# Patient Record
Sex: Female | Born: 1975 | Race: Black or African American | Hispanic: No | Marital: Married | Smoking: Never smoker
Health system: Southern US, Community
[De-identification: ages and names within clinical notes are randomized; demographics above are authoritative.]

## PROBLEM LIST (undated history)

## (undated) DIAGNOSIS — I10 Essential (primary) hypertension: Secondary | ICD-10-CM

## (undated) DIAGNOSIS — E119 Type 2 diabetes mellitus without complications: Secondary | ICD-10-CM

## (undated) HISTORY — PX: REDUCTION MAMMAPLASTY: SUR839

## (undated) HISTORY — PX: BREAST REDUCTION SURGERY: SHX8

---

## 2020-11-22 ENCOUNTER — Ambulatory Visit: Payer: Self-pay

## 2020-11-22 NOTE — Telephone Encounter (Signed)
Pt. Has not yet established care. Complains of right breast pain x 1 week. Pain is below the nipple. No other symptoms. Pt. Will go to UC.  Answer Assessment - Initial Assessment Questions 1. SYMPTOM: "What's the main symptom you're concerned about?"  (e.g., lump, pain, rash, nipple discharge)     Right breast pain 2. LOCATION: "Where is the pain located?"     Right 3. ONSET: "When did pain start?"     1 week ago 4. PRIOR HISTORY: "Do you have any history of prior problems with your breasts?" (e.g., lumps, cancer, fibrocystic breast disease)     No 5. CAUSE: "What do you think is causing this symptom?"     Unsure 6. OTHER SYMPTOMS: "Do you have any other symptoms?" (e.g., fever, breast pain, redness or rash, nipple discharge)     No 7. PREGNANCY-BREASTFEEDING: "Is there any chance you are pregnant?" "When was your last menstrual period?" "Are you breastfeeding?"     No  Protocols used: BREAST [facility]

## 2020-11-23 ENCOUNTER — Other Ambulatory Visit: Payer: Self-pay

## 2020-11-23 ENCOUNTER — Encounter (HOSPITAL_COMMUNITY): Payer: Self-pay | Admitting: *Deleted

## 2020-11-23 ENCOUNTER — Ambulatory Visit (HOSPITAL_COMMUNITY)
Admission: EM | Admit: 2020-11-23 | Discharge: 2020-11-23 | Disposition: A | Discharge status: Home / Self Care | Payer: BC Managed Care – PPO | Attending: Student | Admitting: Student

## 2020-11-23 DIAGNOSIS — R0789 Other chest pain: Secondary | ICD-10-CM | POA: Diagnosis not present

## 2020-11-23 DIAGNOSIS — R079 Chest pain, unspecified: Secondary | ICD-10-CM

## 2020-11-23 HISTORY — DX: Essential (primary) hypertension: I10

## 2020-11-23 HISTORY — DX: Type 2 diabetes mellitus without complications: E11.9

## 2020-11-23 NOTE — ED Triage Notes (Signed)
Pt describes "sharp pain" intermittently to right chest x approx 7 days.  Denies any pain at present.  States has not noticed when asked about reproduceability.  Denies any n/v, dizziness, lightheadedness, SOB or diaphoresis when pain occurs. Takes HTN med inconsistently.

## 2020-11-23 NOTE — ED Provider Notes (Signed)
[facility]    CSN: [id number] Arrival date & time: 11/23/20  1353      History   Chief Complaint Chief Complaint  Patient presents with  . Chest Pain    HPI Latoya Grant is a 45 y.o. female Pt describes "sharp pain" intermittently to right chest x approx 7 days; this lasts for about 1 minute and terminates on its own.  Denies any pain at present.  States has not noticed when asked about reproduceability. Denies trauma but does not she's been doing a new exercise regimen recently. Denies any n/v, dizziness, lightheadedness, SOB or diaphoresis when pain occurs.  Takes HTN med inconsistently- nifedipine. Denies history of other cardiac disease.   HPI  Past Medical History:  Diagnosis Date  . Diabetes mellitus without complication (HCC)   . Hypertension     There are no problems to display for this patient.   Past Surgical History:  Procedure Laterality Date  . BREAST REDUCTION SURGERY    . CESAREAN SECTION      OB History   No obstetric history on file.      Home Medications    Prior to Admission medications   Medication Sig Start Date End Date Taking? Authorizing Provider  METFORMIN HCL PO Take by mouth.   Yes [provider]  NIFEDIPINE PO Take by mouth.   Yes [provider]    Family History Family History  Problem Relation Age of Onset  . Hypertension Mother   . Healthy Father     Social History Social History   Tobacco Use  . Smoking status: Never Smoker  . Smokeless tobacco: Never Used  Vaping Use  . Vaping Use: Never used  Substance Use Topics  . Alcohol use: Yes    Comment: occasionally  . Drug use: Never     Allergies   Patient has no known allergies.   Review of Systems Review of Systems  Respiratory: Negative for apnea, cough, choking, chest tightness, shortness of breath, wheezing and stridor.   Cardiovascular: Positive for chest pain. Negative for palpitations and leg swelling.   Gastrointestinal: Negative for abdominal pain, anal bleeding, constipation, diarrhea, nausea, rectal pain and vomiting.  Neurological: Negative for dizziness, tremors, seizures, weakness, light-headedness, numbness and headaches.  All other systems reviewed and are negative.    Physical Exam Triage Vital Signs ED Triage Vitals  Enc Vitals Group     BP 11/23/20 1409 136/90     Pulse Rate 11/23/20 1409 85     Resp 11/23/20 1409 18     Temp 11/23/20 1409 97.7 F (36.5 C)     Temp Source 11/23/20 1409 Oral     SpO2 11/23/20 1409 98 %     Weight --      Height --      Head Circumference --      Peak Flow --      Pain Score 11/23/20 1411 0     Pain Loc --      Pain Edu? --      Excl. in GC? --    No data found.  Updated Vital Signs BP 136/90 Comment: took HTN med yesterday; states takes inconsistently  Pulse 85   Temp 97.7 F (36.5 C) (Oral)   Resp 18   LMP 10/31/2020 (Approximate)   SpO2 98%   Visual Acuity Right Eye Distance:   Left Eye Distance:   Bilateral Distance:    Right Eye Near:   Left Eye Near:  Bilateral Near:     Physical Exam Vitals reviewed.  Constitutional:      Appearance: She is well-developed.  HENT:     Head: Normocephalic and atraumatic.  Eyes:     Extraocular Movements: Extraocular movements intact.     Pupils: Pupils are equal, round, and reactive to light.  Cardiovascular:     Pulses:          Radial pulses are 2+ on the right side and 2+ on the left side.     Heart sounds: Normal heart sounds.  Pulmonary:     Effort: Pulmonary effort is normal.     Breath sounds: Normal breath sounds. No decreased breath sounds, wheezing, rhonchi or rales.  Chest:     Chest wall: No tenderness.  Abdominal:     Palpations: Abdomen is soft.     Tenderness: There is no abdominal tenderness. There is no guarding or rebound.  Musculoskeletal:     Right lower leg: No edema.     Left lower leg: No edema.  Skin:    General: Skin is warm.   Neurological:     General: No focal deficit present.     Mental Status: She is alert and oriented to person, place, and time.  Psychiatric:        Mood and Affect: Mood normal.        Behavior: Behavior normal.      UC Treatments / Results  Labs (all labs ordered are listed, but only abnormal results are displayed) Labs Reviewed - No data to display  EKG   Radiology No results found.  Procedures Procedures (including critical care time)  Medications Ordered in UC Medications - No data to display  Initial Impression / Assessment and Plan / UC Course  I have reviewed the triage vital signs and the nursing notes.  Pertinent labs & imaging results that were available during my care of the patient were reviewed by me and considered in my medical decision making (see chart for details).     This patient is a 45 year old female presenting with intermittent sharp right-sided chest pain. On exam this is not reproducible.   EKG today NSR. No prior EKG for comparison.   Discussed that while a normal EKG is a good sign, we cannot totally rule out cardiac pathology at this urgent care. To rule out heart attack, she should head to ED for further evaluation. Patient verbalizes she will do this if her pain worsens.   For hypertension, continue nifedipine.  Spent over 40 minutes obtaining H&P, performing physical, interpreting EKG, discussing results, treatment plan and plan for follow-up with patient. Patient agrees with plan.   Final Clinical Impressions(s) / UC Diagnoses   Final diagnoses:  Chest pain with normal EKG  Atypical chest pain     Discharge Instructions     -Your EKG looks good today. However, this cannot totally rule out cardiac pathology.  -To completely rule out a heart attack, you should head to the ED for further evaluation. If your chest pain persists, you feel dizzy, short of breath, etc- call 911 immediately.  -Continue your bloodpressure medication as  directed.    ED Prescriptions    None     PDMP not reviewed this encounter.   Rhys Martini, PA-C 11/23/20 1504

## 2020-11-23 NOTE — Discharge Instructions (Signed)
-  Your EKG looks good today. However, this cannot totally rule out cardiac pathology.  -To completely rule out a heart attack, you should head to the ED for further evaluation. If your chest pain persists, you feel dizzy, short of breath, etc- call 911 immediately.  -Continue your bloodpressure medication as directed.

## 2021-02-06 ENCOUNTER — Other Ambulatory Visit: Payer: Self-pay

## 2021-02-06 ENCOUNTER — Ambulatory Visit: Payer: Self-pay | Attending: Internal Medicine | Admitting: Internal Medicine

## 2021-02-06 ENCOUNTER — Encounter: Payer: Self-pay | Admitting: Internal Medicine

## 2021-02-06 VITALS — BP 125/91 | HR 86 | Resp 16 | Ht 69.0 in | Wt 233.0 lb

## 2021-02-06 DIAGNOSIS — E1159 Type 2 diabetes mellitus with other circulatory complications: Secondary | ICD-10-CM | POA: Insufficient documentation

## 2021-02-06 DIAGNOSIS — E1169 Type 2 diabetes mellitus with other specified complication: Secondary | ICD-10-CM

## 2021-02-06 DIAGNOSIS — I152 Hypertension secondary to endocrine disorders: Secondary | ICD-10-CM

## 2021-02-06 DIAGNOSIS — Z1231 Encounter for screening mammogram for malignant neoplasm of breast: Secondary | ICD-10-CM

## 2021-02-06 DIAGNOSIS — Z7689 Persons encountering health services in other specified circumstances: Secondary | ICD-10-CM

## 2021-02-06 DIAGNOSIS — E669 Obesity, unspecified: Secondary | ICD-10-CM

## 2021-02-06 MED ORDER — TRUE METRIX METER W/DEVICE KIT
PACK | 0 refills | Status: AC
  Filled 2021-02-06: qty 1, 365d supply, fill #0
  Filled 2021-03-14: qty 1, 30d supply, fill #0

## 2021-02-06 MED ORDER — TRUEPLUS LANCETS 28G MISC
4 refills | Status: AC
  Filled 2021-02-06: qty 100, 25d supply, fill #0
  Filled 2021-03-14: qty 100, 30d supply, fill #0

## 2021-02-06 MED ORDER — METFORMIN HCL 500 MG PO TABS
500.0000 mg | ORAL_TABLET | Freq: Two times a day (BID) | ORAL | 3 refills | Status: AC
  Filled 2021-02-06: qty 180, 90d supply, fill #0
  Filled 2021-02-06: qty 60, 30d supply, fill #0
  Filled 2021-02-06: qty 180, 90d supply, fill #0
  Filled 2021-03-14: qty 60, 30d supply, fill #0

## 2021-02-06 MED ORDER — LOSARTAN POTASSIUM 25 MG PO TABS
25.0000 mg | ORAL_TABLET | Freq: Every day | ORAL | 3 refills | Status: AC
  Filled 2021-02-06 – 2021-03-14 (×2): qty 30, 30d supply, fill #0

## 2021-02-06 MED ORDER — TRUE METRIX BLOOD GLUCOSE TEST VI STRP
ORAL_STRIP | 12 refills | Status: AC
  Filled 2021-02-06 – 2021-03-14 (×2): qty 100, 25d supply, fill #0

## 2021-02-06 NOTE — Patient Instructions (Addendum)
Please sign a release when you go to check out for [place] to get your medical records from your previous primary physician in [place].  Restart metformin at 500 mg twice a day.  Try to check your blood sugars at least 2-3 times a week.  The goal for blood sugars before meals is 90-130.  If you check 2 hours after meal, the goal is to be less than 180.  We have started you on a low-dose of a blood pressure medication called Losartan to take once a day.  Try to limit the salt in the foods as much as possible.    Diabetes Mellitus and Nutrition, Adult When you have diabetes, or diabetes mellitus, it is very important to have healthy eating habits because your blood sugar (glucose) levels are greatly affected by what you eat and drink. Eating healthy foods in the right amounts, at about the same times every day, can help you:  Control your blood glucose.  Lower your risk of heart disease.  Improve your blood pressure.  Reach or maintain a healthy weight. What can affect my meal plan? Every person with diabetes is different, and each person has different needs for a meal plan. Your health care provider may recommend that you work with a dietitian to make a meal plan that is best for you. Your meal plan may vary depending on factors such as:  The calories you need.  The medicines you take.  Your weight.  Your blood glucose, blood pressure, and cholesterol levels.  Your activity level.  Other health conditions you have, such as heart or kidney disease. How do carbohydrates affect me? Carbohydrates, also called carbs, affect your blood glucose level more than any other type of food. Eating carbs naturally raises the amount of glucose in your blood. Carb counting is a method for keeping track of how many carbs you eat. Counting carbs is important to keep your blood glucose at a healthy level, especially if you use insulin or take certain oral diabetes medicines. It is important to know how  many carbs you can safely have in each meal. This is different for every person. Your dietitian can help you calculate how many carbs you should have at each meal and for each snack. How does alcohol affect me? Alcohol can cause a sudden decrease in blood glucose (hypoglycemia), especially if you use insulin or take certain oral diabetes medicines. Hypoglycemia can be a life-threatening condition. Symptoms of hypoglycemia, such as sleepiness, dizziness, and confusion, are similar to symptoms of having too much alcohol.  Do not drink alcohol if: ? Your health care provider tells you not to drink. ? You are pregnant, may be pregnant, or are planning to become pregnant.  If you drink alcohol: ? Do not drink on an empty stomach. ? Limit how much you use to:  0-1 drink a day for women.  0-2 drinks a day for men. ? Be aware of how much alcohol is in your drink. In the [place] one drink equals one 12 oz bottle of beer (355 mL), one 5 oz glass of wine (148 mL), or one 1 oz glass of hard liquor (44 mL). ? Keep yourself hydrated with water, diet soda, or unsweetened iced tea.  Keep in mind that regular soda, juice, and other mixers may contain a lot of sugar and must be counted as carbs. What are tips for following this plan? Reading food labels  Start by checking the serving size on the "Nutrition Facts"  label of packaged foods and drinks. The amount of calories, carbs, fats, and other nutrients listed on the label is based on one serving of the item. Many items contain more than one serving per package.  Check the total grams (g) of carbs in one serving. You can calculate the number of servings of carbs in one serving by dividing the total carbs by 15. For example, if a food has 30 g of total carbs per serving, it would be equal to 2 servings of carbs.  Check the number of grams (g) of saturated fats and trans fats in one serving. Choose foods that have a low amount or none of these fats.  Check  the number of milligrams (mg) of salt (sodium) in one serving. Most people should limit total sodium intake to less than 2,300 mg per day.  Always check the nutrition information of foods labeled as "low-fat" or "nonfat." These foods may be higher in added sugar or refined carbs and should be avoided.  Talk to your dietitian to identify your daily goals for nutrients listed on the label. Shopping  Avoid buying canned, pre-made, or processed foods. These foods tend to be high in fat, sodium, and added sugar.  Shop around the outside edge of the grocery store. This is where you will most often find fresh fruits and vegetables, bulk grains, fresh meats, and fresh dairy. Cooking  Use low-heat cooking methods, such as baking, instead of high-heat cooking methods like deep frying.  Cook using healthy oils, such as olive, canola, or sunflower oil.  Avoid cooking with butter, cream, or high-fat meats. Meal planning  Eat meals and snacks regularly, preferably at the same times every day. Avoid going long periods of time without eating.  Eat foods that are high in fiber, such as fresh fruits, vegetables, beans, and whole grains. Talk with your dietitian about how many servings of carbs you can eat at each meal.  Eat 4-6 oz (112-168 g) of lean protein each day, such as lean meat, chicken, fish, eggs, or tofu. One ounce (oz) of lean protein is equal to: ? 1 oz (28 g) of meat, chicken, or fish. ? 1 egg. ?  cup (62 g) of tofu.  Eat some foods each day that contain healthy fats, such as avocado, nuts, seeds, and fish.   What foods should I eat? Fruits Berries. Apples. Oranges. Peaches. Apricots. Plums. Grapes. Mango. Papaya. Pomegranate. Kiwi. Cherries. Vegetables Lettuce. Spinach. Leafy greens, including kale, chard, collard greens, and mustard greens. Beets. Cauliflower. Cabbage. Broccoli. Carrots. Green beans. Tomatoes. Peppers. Onions. Cucumbers. Brussels sprouts. Grains Whole grains, such  as whole-wheat or whole-grain bread, crackers, tortillas, cereal, and pasta. Unsweetened oatmeal. Quinoa. Brown or wild rice. Meats and other proteins Seafood. Poultry without skin. Lean cuts of poultry and beef. Tofu. Nuts. Seeds. Dairy Low-fat or fat-free dairy products such as milk, yogurt, and cheese. The items listed above may not be a complete list of foods and beverages you can eat. Contact a dietitian for more information. What foods should I avoid? Fruits Fruits canned with syrup. Vegetables Canned vegetables. Frozen vegetables with butter or cream sauce. Grains Refined white flour and flour products such as bread, pasta, snack foods, and cereals. Avoid all processed foods. Meats and other proteins Fatty cuts of meat. Poultry with skin. Breaded or fried meats. Processed meat. Avoid saturated fats. Dairy Full-fat yogurt, cheese, or milk. Beverages Sweetened drinks, such as soda or iced tea. The items listed above may not be a  complete list of foods and beverages you should avoid. Contact a dietitian for more information. Questions to ask a health care provider  Do I need to meet with a diabetes educator?  Do I need to meet with a dietitian?  What number can I call if I have questions?  When are the best times to check my blood glucose? Where to find more information:  American Diabetes Association: diabetes.org  Academy of Nutrition and Dietetics: www.eatright.[employer] of Diabetes and Digestive and Kidney Diseases: CarFlippers.tn  Association of Diabetes Care and Education Specialists: www.diabeteseducator.org Summary  It is important to have healthy eating habits because your blood sugar (glucose) levels are greatly affected by what you eat and drink.  A healthy meal plan will help you control your blood glucose and maintain a healthy lifestyle.  Your health care provider may recommend that you work with a dietitian to make a meal plan that is  best for you.  Keep in mind that carbohydrates (carbs) and alcohol have immediate effects on your blood glucose levels. It is important to count carbs and to use alcohol carefully. This information is not intended to replace advice given to you by your health care provider. Make sure you discuss any questions you have with your health care provider. Document Revised: 08/22/2019 Document Reviewed: 08/22/2019 Elsevier Patient Education  2021 [employer].

## 2021-02-06 NOTE — Progress Notes (Signed)
[id number] a1c- 9.3  Pt states her neck has been hurting since last week

## 2021-02-06 NOTE — Progress Notes (Signed)
Patient ID: Latoya Grant, female    DOB: Jul 01, 1976  MRN: [id number]  CC: New Patient (Initial Visit) and Diabetes   Subjective: Latoya Grant is a 45 y.o. female who presents for new pt visit Her concerns today include:  With history of HTN, DM,  Previous PCP was in [place] # 9.  Relocated 1 yr ago. Pt with hx of DM and HTN.  DIABETES TYPE 2 Last A1C:   A1C 9.3/BS 181 Med Adherence: She was previously on metformin 500 mg daily.  Out of Metformin for over 1 yr. Medication side effects:  [] Yes    [x] No Home Monitoring?  [] Yes    [x] No.  Needs supplies Home glucose results range: Diet Adherence: [] Yes    [x] No -was doing good but recently eating more sweets Exercise: [] Yes    [x] No was doing good but fell Hypoglycemic episodes?: [] Yes    [] No Numbness of the feet? [] Yes    [x] No Retinopathy hx? [] Yes    [x] No Last eye exam:  End of March at [facility] specialist, received new pair of glasses but vision more blurry with the glasses. Comments:  HTN:  Dx 1-2 yrs ago.  She was on nifedipine.  Out of Nifedipine for several mths. Checks BP at home 2 x a mth.  Gives # of 130/upper 73s.   Limits salt in foods No CP/SOB/LE edema/aches or dizziness.  HM: last pap was 2 yrs ago and was nl.  Last MMG 2 yrs. never had colon cancer screening.  Past medical, social, family history, surgical history reviewed and updated.  Current Outpatient Medications on File Prior to Visit  Medication Sig Dispense Refill  . NIFEDIPINE PO Take by mouth.     No current facility-administered medications on file prior to visit.    No Known Allergies  Social History   Socioeconomic History  . Marital status: Married    Spouse name: Not on file  . Number of children: 1  . Years of education: Not on file  . Highest education level: Some college, no degree  Occupational History  . Not on file  Tobacco Use  . Smoking status: Never Smoker  .  Smokeless tobacco: Never Used  Vaping Use  . Vaping Use: Never used  Substance and Sexual Activity  . Alcohol use: Yes    Comment: occasionally  . Drug use: Never  . Sexual activity: Yes    Birth control/protection: None  Other Topics Concern  . Not on file  Social History Narrative  . Not on file   Social Determinants of Health   Financial Resource Strain: Not on file  Food Insecurity: Not on file  Transportation Needs: Not on file  Physical Activity: Not on file  Stress: Not on file  Social Connections: Not on file  Intimate Partner Violence: Not on file    Family History  Problem Relation Age of Onset  . Hypertension Mother   . Healthy Father     Past Surgical History:  Procedure Laterality Date  . BREAST REDUCTION SURGERY    . CESAREAN SECTION    . CESAREAN SECTION      ROS: Review of Systems Negative except as stated above  PHYSICAL EXAM: BP (!) 125/91   Pulse 86   Resp 16   Ht 5' 9" (1.753 m)   Wt 233 lb (105.7 kg)   SpO2 99%   BMI 34.41  kg/m   Physical Exam  General appearance - alert, well appearing, middle-aged African-American female and in no distress Mental status - normal mood, behavior, speech, dress, motor activity, and thought processes Eyes - pupils equal and reactive, extraocular eye movements intact Nose - normal and patent, no erythema, discharge or polyps.  Turbinates are moderately enlarged. Mouth - mucous membranes moist, pharynx normal without lesions Neck - supple, no significant adenopathy Chest - clear to auscultation, no wheezes, rales or rhonchi, symmetric air entry Heart - normal rate, regular rhythm, normal S1, S2, no murmurs, rubs, clicks or gallops Extremities - peripheral pulses normal, no pedal edema, no clubbing or cyanosis Diabetic Foot Exam - Simple   Simple Foot Form Visual Inspection No deformities, no ulcerations, no other skin breakdown bilaterally: Yes Sensation Testing Intact to touch and monofilament  testing bilaterally: Yes Pulse Check Posterior Tibialis and Dorsalis pulse intact bilaterally: Yes Comments     ASSESSMENT AND PLAN: 1. Encounter to establish care Request that she sign a release when she leaves today for [place] to get her medical records from previous PCP in [place]. -We discussed having baseline labs done today including cholesterol, chemistry, CBC.  Patient also wanted to have STI screening.  However she wanted to know the cost of today's visit and how much it would cost to have the labs done since no insurance. I had [place] meet with her to discuss cost. She decided to hold off on having baseline blood test done until she gets insurance which she plans to apply for her again.  2. Type 2 diabetes mellitus with obesity ([place]) Not at goal. Discussed the importance of healthy eating habits, regular aerobic exercise (at least 150 minutes a week as tolerated) and medication compliance to achieve or maintain control of diabetes and prevent complications. Dietary counseling given and printed information also given. Restart metformin at 500 mg twice a day.  Strongly recommend that we check at least a BMP given that we are putting her on metformin and also will be starting her on an ARB for blood pressure.  Patient was agreeable to having the microalbumin and BMP done. - POCT glucose (manual entry) - POCT glycosylated hemoglobin (Hb A1C) - Microalbumin / creatinine urine ratio - metFORMIN (GLUCOPHAGE) 500 MG tablet; Take 1 tablet (500 mg total) by mouth 2 (two) times daily with a meal.  Dispense: 180 tablet; Refill: 3 - glucose blood (TRUE METRIX BLOOD GLUCOSE TEST) test strip; Use as instructed  Dispense: 100 each; Refill: 12 - Blood Glucose Monitoring Suppl (TRUE METRIX METER) w/Device KIT; Use as directed  Dispense: 1 kit; Refill: 0 - TRUEplus Lancets 28G MISC; Use as directed  Dispense: 100 each; Refill: 4 - losartan (COZAAR) 25 MG tablet; Take 1 tablet (25 mg total) by mouth  daily.  Dispense: 30 tablet; Refill: 3 - Basic Metabolic Panel  3. Hypertension associated with diabetes ([place]) Close to goal.  Diastolic blood pressure running higher at home.  I have placed her on a very low dose of losartan.  DASH diet discussed and encouraged.  4. Encounter for screening mammogram for malignant neoplasm of breast - MM Digital Screening; Future     Patient was given the opportunity to ask questions.  Patient verbalized understanding of the plan and was able to repeat key elements of the plan.   Orders Placed This Encounter  Procedures  . MM Digital Screening  . Microalbumin / creatinine urine ratio  . Basic Metabolic Panel  . POCT glucose (manual entry)  .  POCT glycosylated hemoglobin (Hb A1C)     Requested Prescriptions   Signed Prescriptions Disp Refills  . metFORMIN (GLUCOPHAGE) 500 MG tablet 180 tablet 3    Sig: Take 1 tablet (500 mg total) by mouth 2 (two) times daily with a meal.  . glucose blood (TRUE METRIX BLOOD GLUCOSE TEST) test strip 100 each 12    Sig: Use as instructed  . Blood Glucose Monitoring Suppl (TRUE METRIX METER) w/Device KIT 1 kit 0    Sig: Use as directed  . TRUEplus Lancets 28G MISC 100 each 4    Sig: Use as directed  . losartan (COZAAR) 25 MG tablet 30 tablet 3    Sig: Take 1 tablet (25 mg total) by mouth daily.    Return in about 3 months (around 05/09/2021) for Sign release for [place] to get records from [place].  Karle Plumber, MD, FACP

## 2021-02-07 LAB — POCT GLYCOSYLATED HEMOGLOBIN (HGB A1C): HbA1c, POC (controlled diabetic range): 9.3 % — AB (ref 0.0–7.0)

## 2021-02-07 LAB — GLUCOSE, POCT (MANUAL RESULT ENTRY): POC Glucose: 181 mg/dl — AB (ref 70–99)

## 2021-02-13 ENCOUNTER — Other Ambulatory Visit: Payer: Self-pay

## 2021-03-08 ENCOUNTER — Ambulatory Visit
Admission: RE | Admit: 2021-03-08 | Discharge: 2021-03-08 | Disposition: A | Discharge status: Home / Self Care | Payer: Self-pay | Source: Ambulatory Visit | Attending: Internal Medicine | Admitting: Internal Medicine

## 2021-03-08 DIAGNOSIS — Z1231 Encounter for screening mammogram for malignant neoplasm of breast: Secondary | ICD-10-CM

## 2021-03-13 ENCOUNTER — Other Ambulatory Visit: Payer: Self-pay | Admitting: Internal Medicine

## 2021-03-13 DIAGNOSIS — R928 Other abnormal and inconclusive findings on diagnostic imaging of breast: Secondary | ICD-10-CM

## 2021-03-14 ENCOUNTER — Ambulatory Visit: Payer: No Typology Code available for payment source | Attending: Internal Medicine

## 2021-03-14 ENCOUNTER — Other Ambulatory Visit: Payer: Self-pay

## 2021-03-14 ENCOUNTER — Encounter (INDEPENDENT_AMBULATORY_CARE_PROVIDER_SITE_OTHER): Payer: Self-pay

## 2021-03-15 LAB — BASIC METABOLIC PANEL
BUN/Creatinine Ratio: 12 (ref 9–23)
BUN: 11 mg/dL (ref 6–24)
CO2: 21 mmol/L (ref 20–29)
Calcium: 9.3 mg/dL (ref 8.7–10.2)
Chloride: 100 mmol/L (ref 96–106)
Creatinine, Ser: 0.92 mg/dL (ref 0.57–1.00)
Glucose: 235 mg/dL — ABNORMAL HIGH (ref 65–99)
Potassium: 4.1 mmol/L (ref 3.5–5.2)
Sodium: 136 mmol/L (ref 134–144)
eGFR: 78 mL/min/{1.73_m2} (ref >59)

## 2021-03-15 LAB — MICROALBUMIN / CREATININE URINE RATIO
Creatinine, Urine: 236.5 mg/dL
Microalb/Creat Ratio: 6 mg/g creat (ref 0–29)
Microalbumin, Urine: 15.1 ug/mL

## 2021-04-04 ENCOUNTER — Other Ambulatory Visit: Payer: No Typology Code available for payment source

## 2021-04-07 ENCOUNTER — Other Ambulatory Visit: Payer: No Typology Code available for payment source

## 2021-04-23 ENCOUNTER — Ambulatory Visit
Admission: RE | Admit: 2021-04-23 | Discharge: 2021-04-23 | Disposition: A | Discharge status: Home / Self Care | Payer: No Typology Code available for payment source | Source: Ambulatory Visit | Attending: Internal Medicine | Admitting: Internal Medicine

## 2021-04-23 ENCOUNTER — Other Ambulatory Visit: Payer: Self-pay

## 2021-04-23 ENCOUNTER — Other Ambulatory Visit: Payer: Self-pay | Admitting: Internal Medicine

## 2021-04-23 DIAGNOSIS — R928 Other abnormal and inconclusive findings on diagnostic imaging of breast: Secondary | ICD-10-CM

## 2021-04-23 DIAGNOSIS — N6489 Other specified disorders of breast: Secondary | ICD-10-CM

## 2021-05-09 ENCOUNTER — Ambulatory Visit: Payer: Self-pay | Admitting: Internal Medicine

## 2022-05-08 IMAGING — MG MM DIGITAL DIAGNOSTIC UNILAT*R* W/ TOMO W/ CAD
6 of 10 series · 6 of 30 positions shown · non-contrast
Comparison: Previous exam(s).

CLINICAL DATA: 44-year-old female presenting as a recall from
screening for possible right breast asymmetry. Patient states she
did have a mammogram approximately 2 years ago in [REDACTED], but
these are not available at the time of dictation. History of
reduction mammoplasty.

EXAM:
DIGITAL DIAGNOSTIC UNILATERAL RIGHT MAMMOGRAM WITH TOMOSYNTHESIS AND
CAD; ULTRASOUND RIGHT BREAST LIMITED
TECHNIQUE: Right digital diagnostic mammography and breast tomosynthesis was
performed. The images were evaluated with computer-aided detection.;
Targeted ultrasound examination of the right breast was performed

[R MLO synth-2D (1 of 2)]
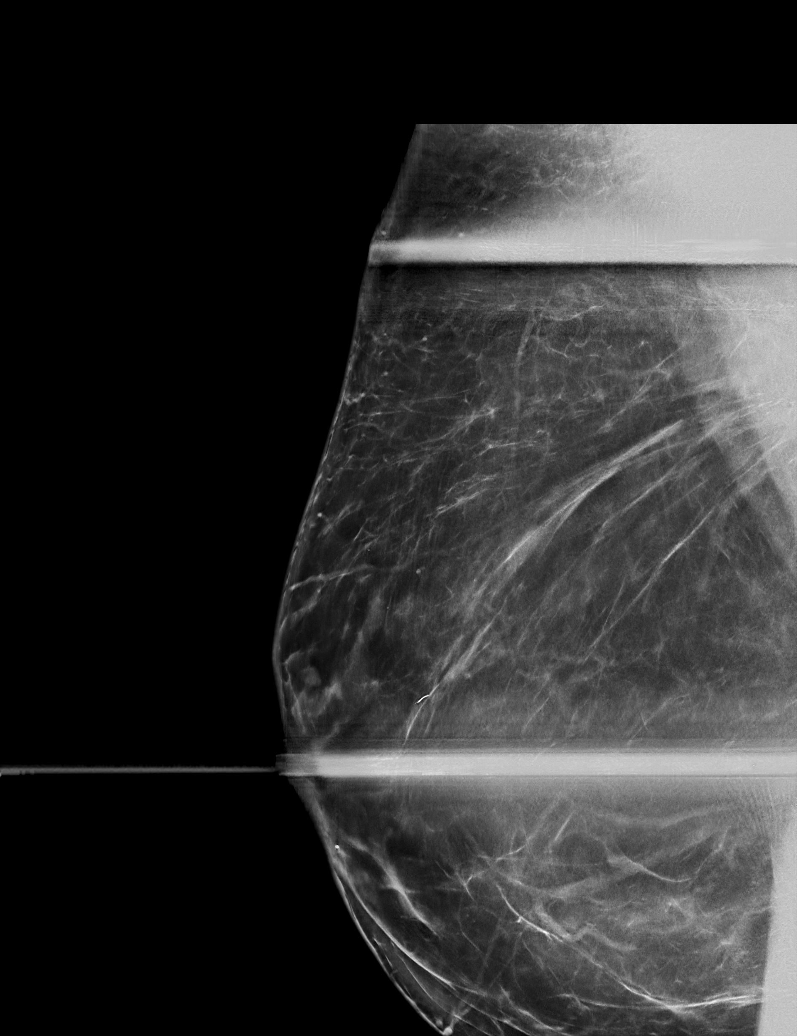

[R MLO synth-2D (2 of 2)]
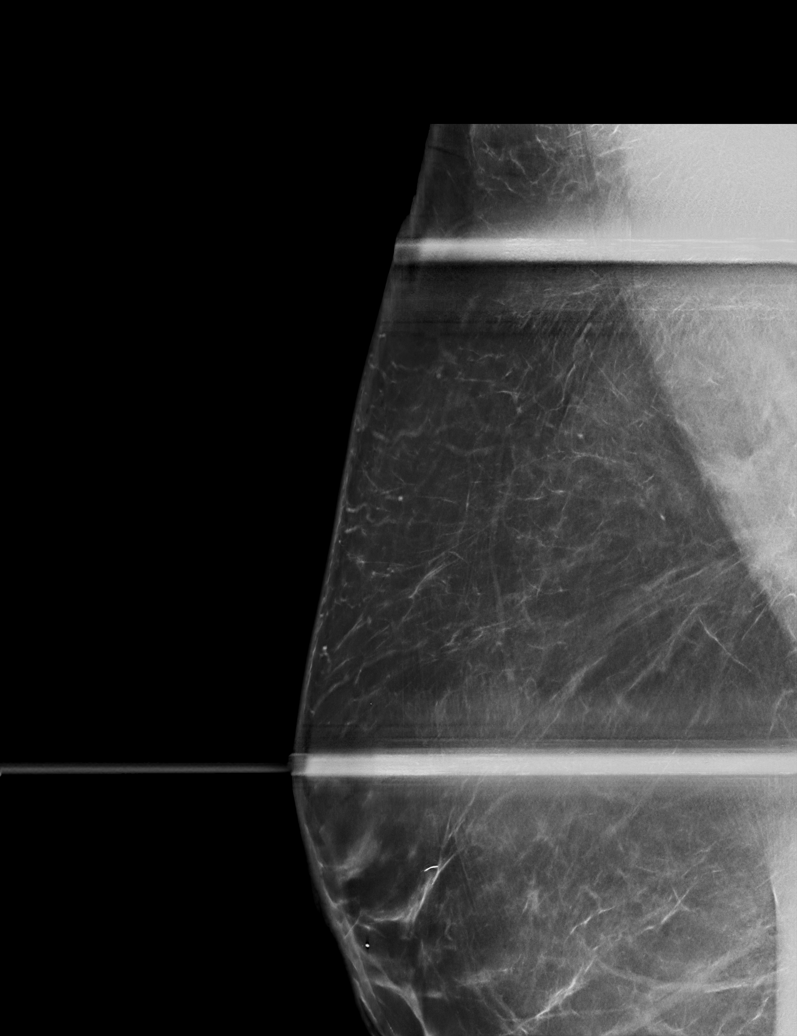

[R CC synth-2D (1 of 2)]
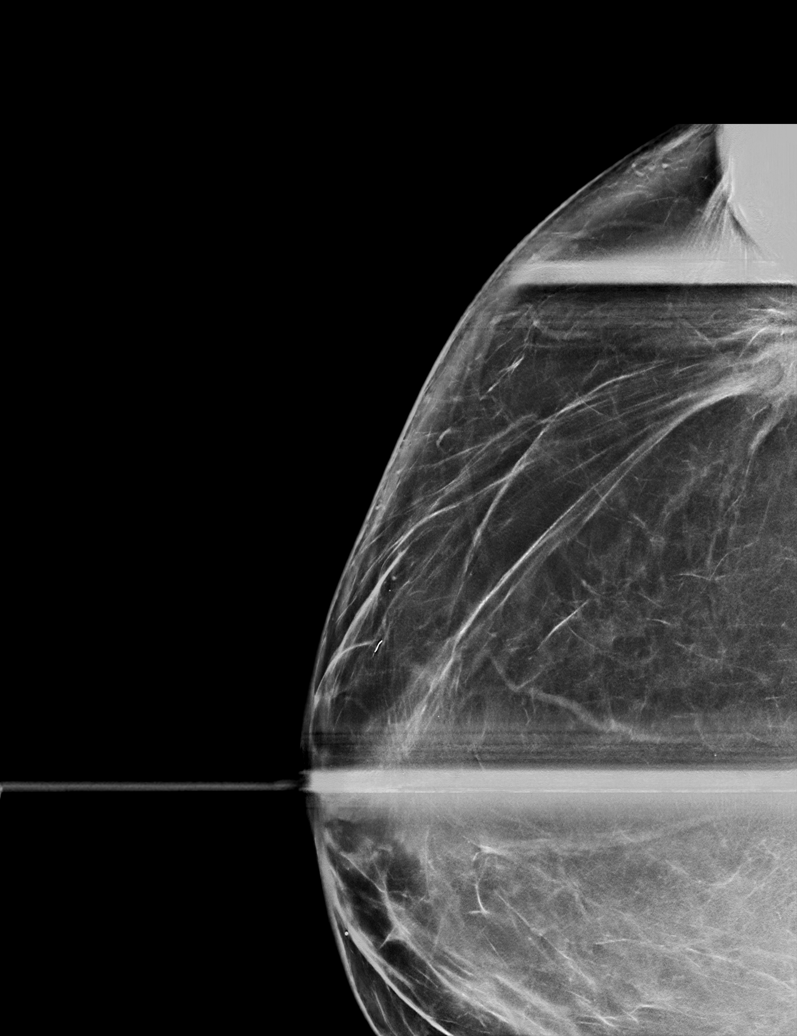

[R CC synth-2D (2 of 2)]
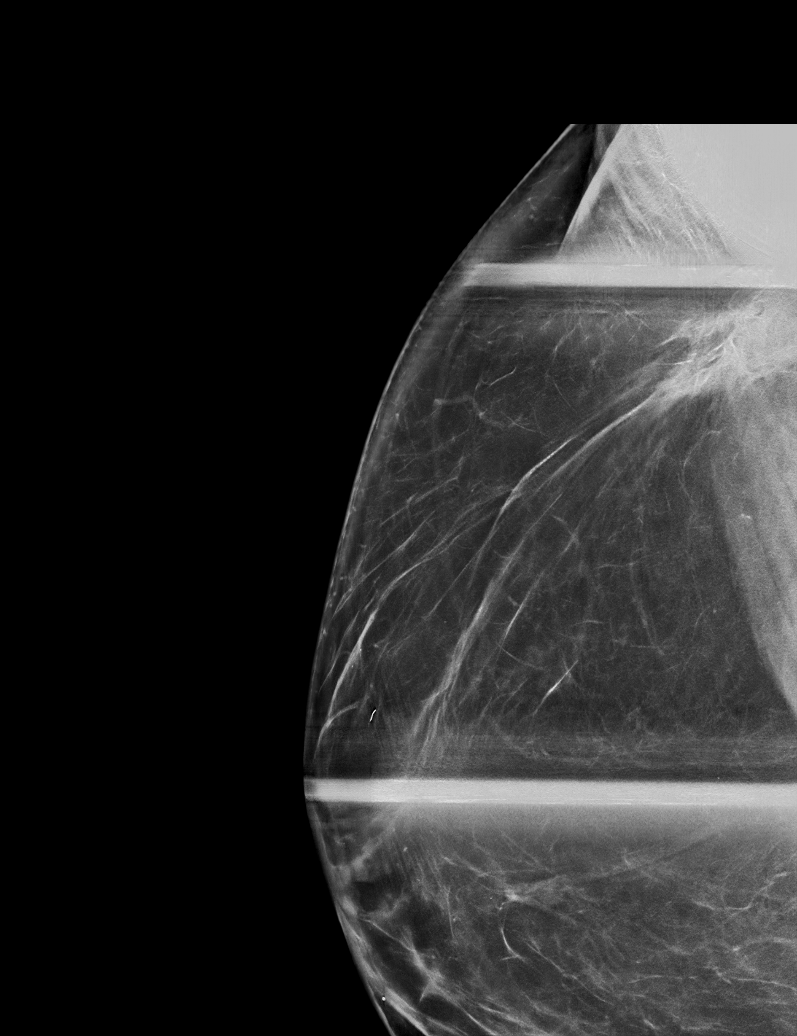

[R XCCL synth-2D]
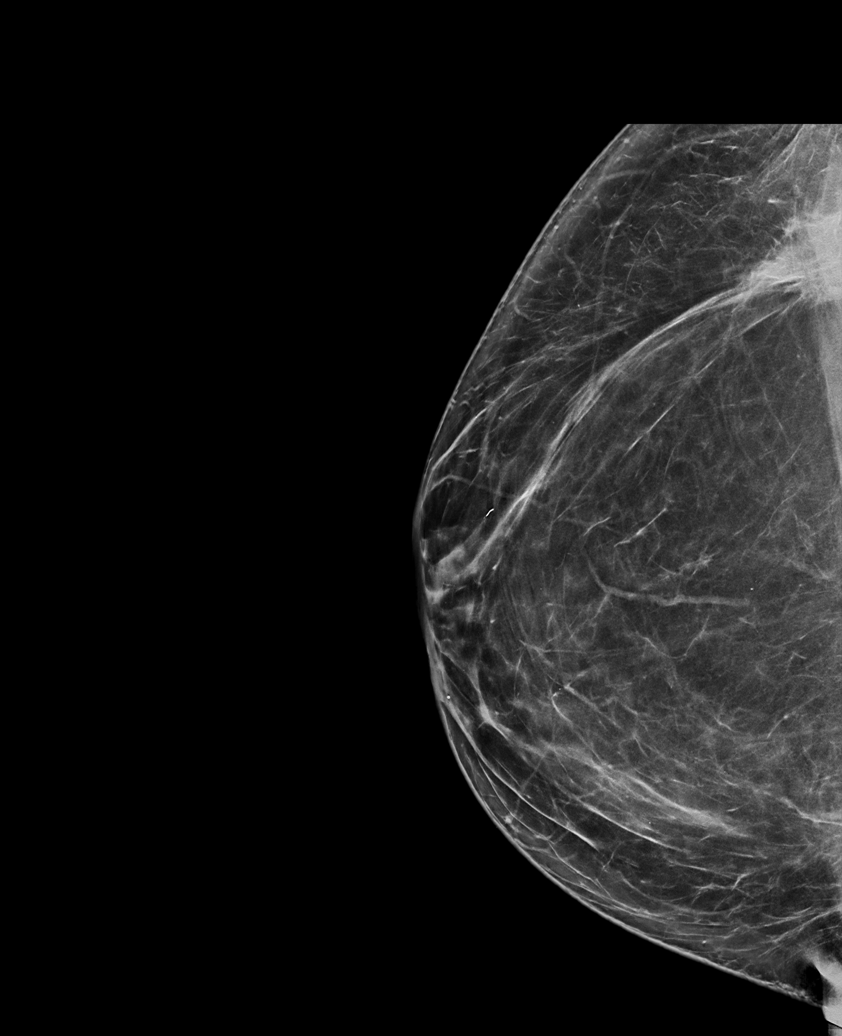

[R XCCL tomo · tomo slice 44/87.0]
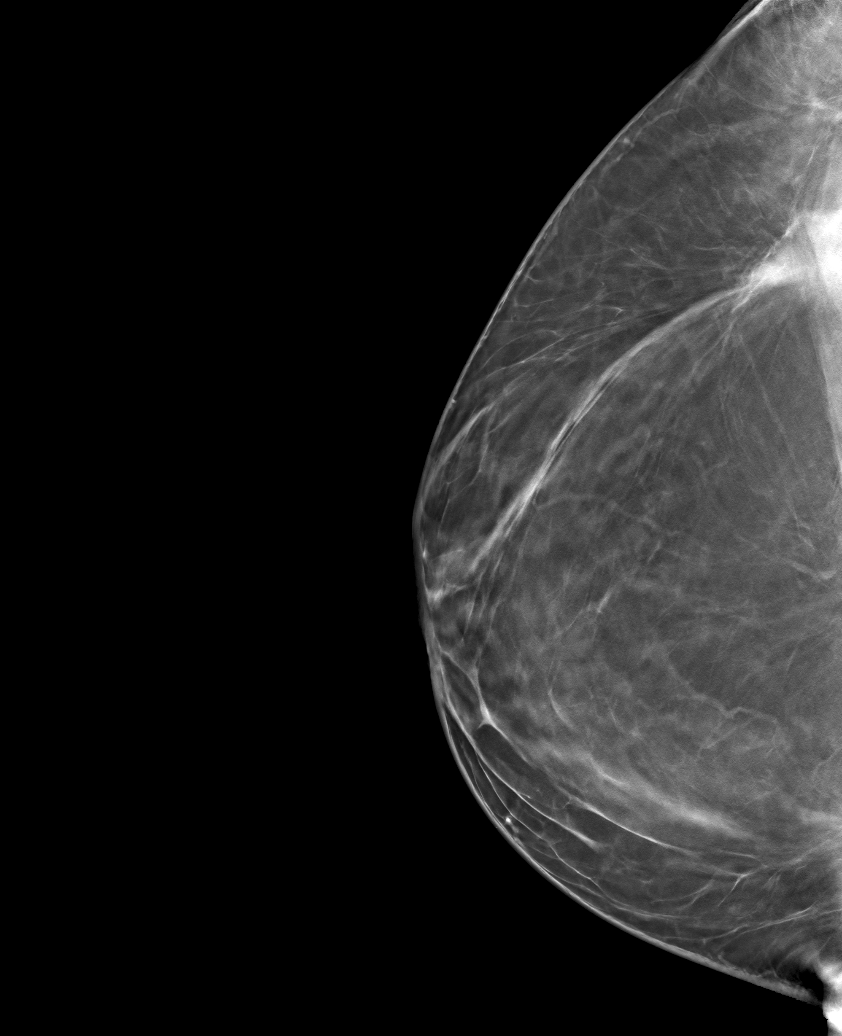

[6 of 30 positions shown; findings below may reference images not displayed]

ACR Breast Density Category b: There are scattered areas of
fibroglandular density.
FINDINGS: Mammogram:

Spot compression tomosynthesis and exaggerated cc tomosynthesis
views of the right breast were performed demonstrating persistence
of a partially visualized asymmetry with distortion in the outer
right breast posterior depth, possibly representing scarring.

On physical exam there is a scar extending into the lateral aspect
of the breast. I do not palpate a discrete mass.

Ultrasound:

Targeted ultrasound performed in the right breast at 9 o'clock 13 cm
from the nipple demonstrating echogenic tissue, most likely
representing scarring. No internal vascularity. There is no
suspicious solid mass.
IMPRESSION: Probably benign asymmetry with distortion in the outer right breast,
likely related to scarring from reduction surgery.

RECOMMENDATION:
1. We will attempt to obtain the patient's outside imaging to
demonstrate stability of the right breast asymmetry/distortion. The
patient will be contacted with further recommendations if the priors
can be obtained.

2. If the outside priors cannot be obtained, recommend diagnostic
right breast mammogram in 6 months to ensure stability.

I have discussed the findings and recommendations with the patient.
If applicable, a reminder letter will be sent to the patient
regarding the next appointment.

BI-RADS CATEGORY  3: Probably benign.

## 2022-05-08 IMAGING — US US BREAST*R* LIMITED INC AXILLA
1 series · 6 of 6 positions shown · non-contrast
Comparison: Previous exam(s).

CLINICAL DATA: 44-year-old female presenting as a recall from
screening for possible right breast asymmetry. Patient states she
did have a mammogram approximately 2 years ago in [REDACTED], but
these are not available at the time of dictation. History of
reduction mammoplasty.

EXAM:
DIGITAL DIAGNOSTIC UNILATERAL RIGHT MAMMOGRAM WITH TOMOSYNTHESIS AND
CAD; ULTRASOUND RIGHT BREAST LIMITED
TECHNIQUE: Right digital diagnostic mammography and breast tomosynthesis was
performed. The images were evaluated with computer-aided detection.;
Targeted ultrasound examination of the right breast was performed

[Series 1: us breast*right* limited inc axilla · 0.07mm/px · 6 of 6 slices shown]
[im 1/6]
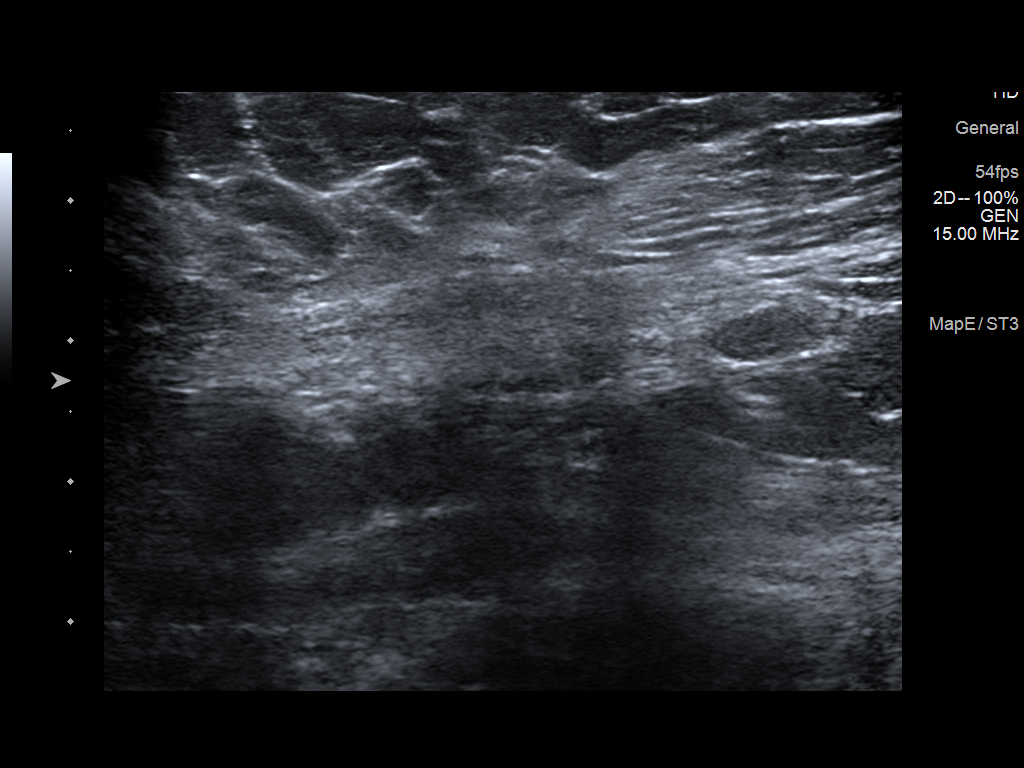
[im 2/6]
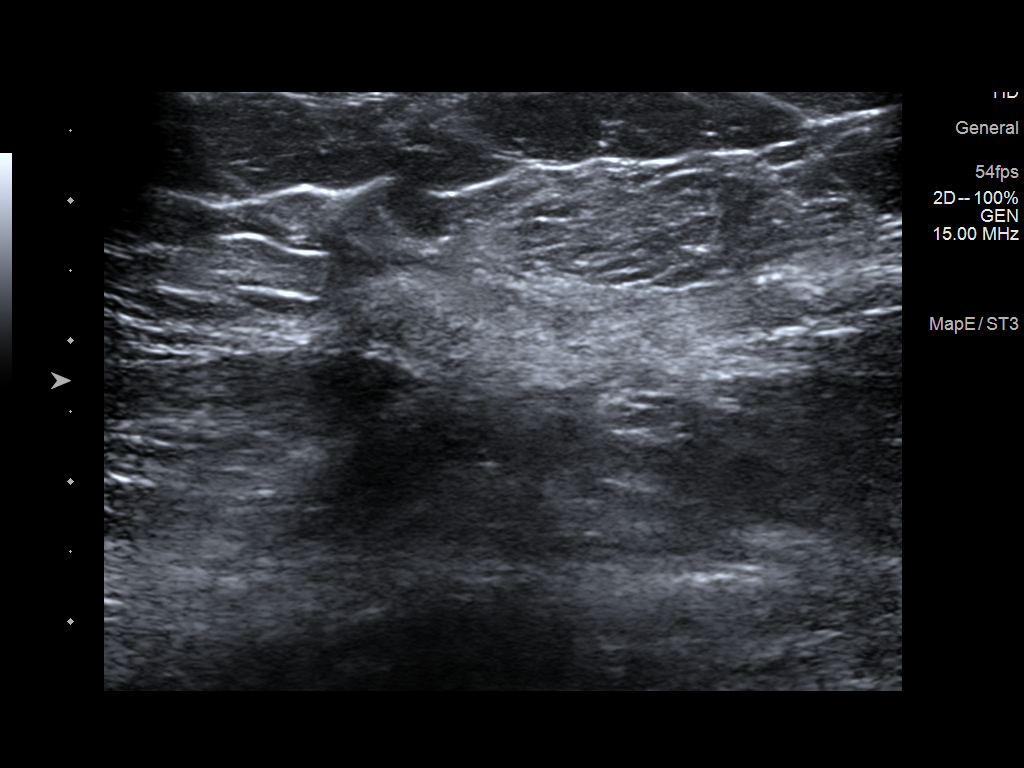
[im 3/6]
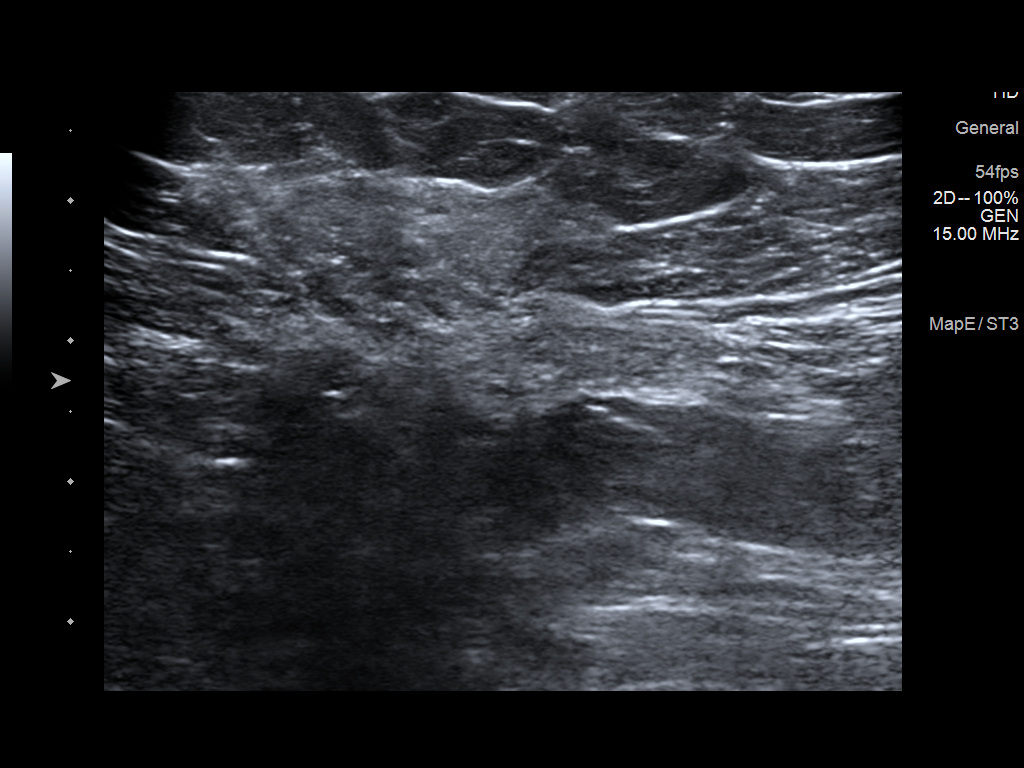
[im 4/6]
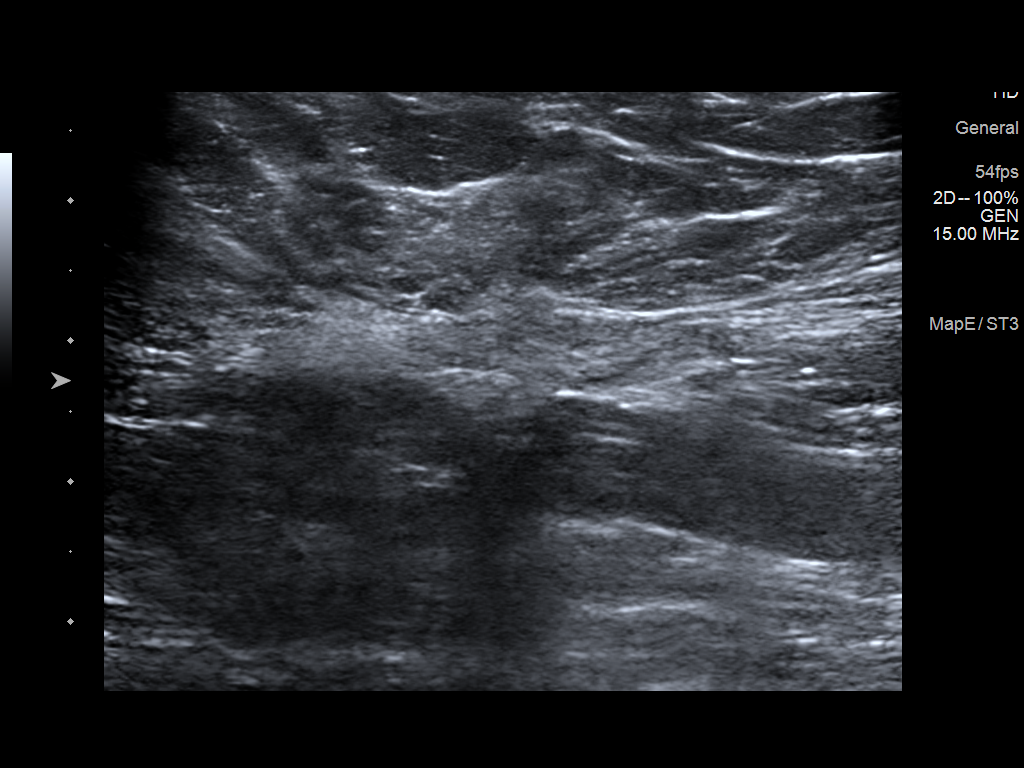
[im 5/6]
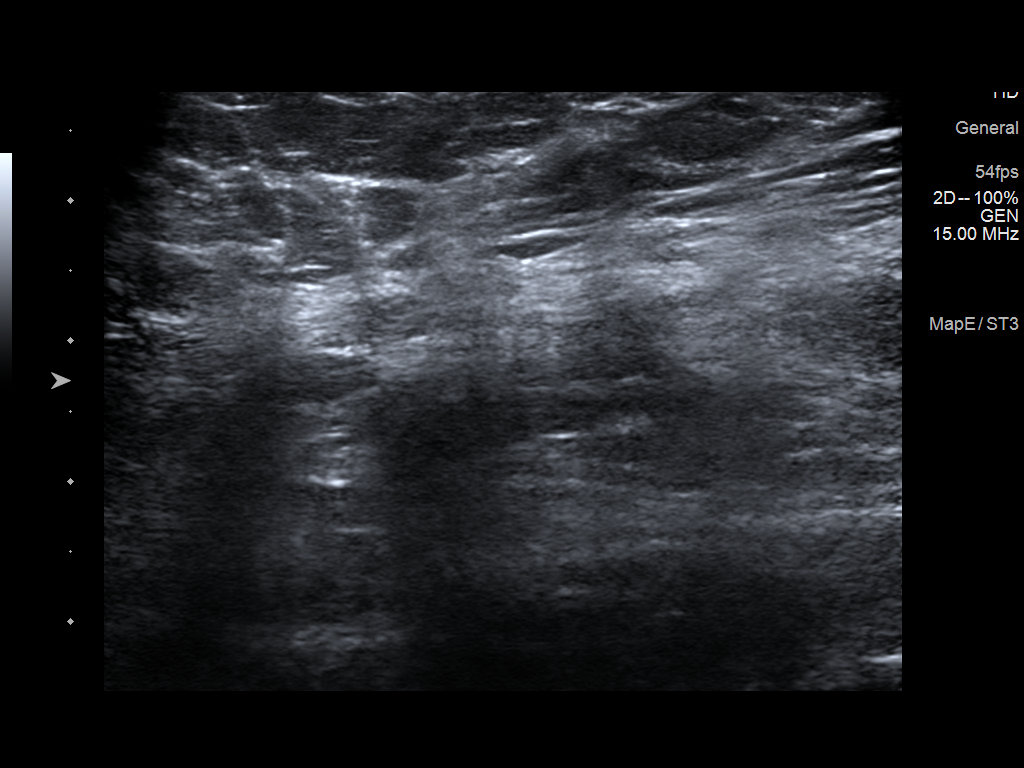
[im 6/6]
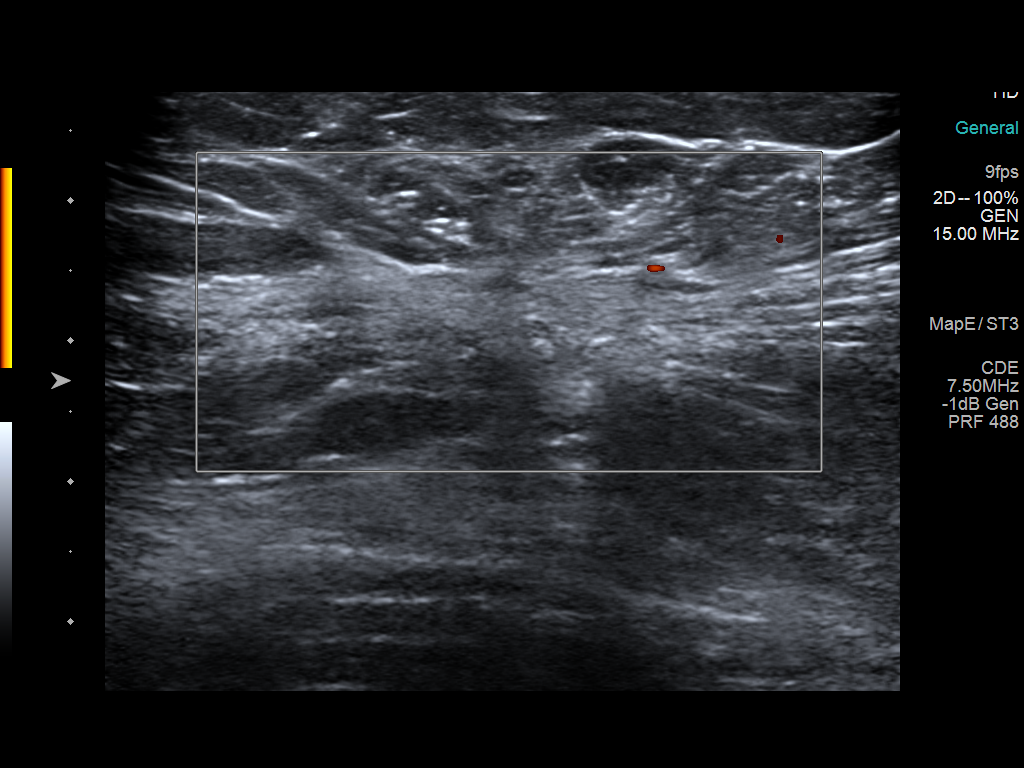

[6 of 6 positions shown; findings below may reference images not displayed]

ACR Breast Density Category b: There are scattered areas of
fibroglandular density.
FINDINGS: Mammogram:

Spot compression tomosynthesis and exaggerated cc tomosynthesis
views of the right breast were performed demonstrating persistence
of a partially visualized asymmetry with distortion in the outer
right breast posterior depth, possibly representing scarring.

On physical exam there is a scar extending into the lateral aspect
of the breast. I do not palpate a discrete mass.

Ultrasound:

Targeted ultrasound performed in the right breast at 9 o'clock 13 cm
from the nipple demonstrating echogenic tissue, most likely
representing scarring. No internal vascularity. There is no
suspicious solid mass.
IMPRESSION: Probably benign asymmetry with distortion in the outer right breast,
likely related to scarring from reduction surgery.

RECOMMENDATION:
1. We will attempt to obtain the patient's outside imaging to
demonstrate stability of the right breast asymmetry/distortion. The
patient will be contacted with further recommendations if the priors
can be obtained.

2. If the outside priors cannot be obtained, recommend diagnostic
right breast mammogram in 6 months to ensure stability.

I have discussed the findings and recommendations with the patient.
If applicable, a reminder letter will be sent to the patient
regarding the next appointment.

BI-RADS CATEGORY  3: Probably benign.
